# Patient Record
Sex: Female | Born: 1955 | Race: Black or African American | Hispanic: No | Marital: Married | Smoking: Current every day smoker
Health system: Southern US, Community
[De-identification: ages and names within clinical notes are randomized; demographics above are authoritative.]

## PROBLEM LIST (undated history)

## (undated) DIAGNOSIS — R569 Unspecified convulsions: Secondary | ICD-10-CM

---

## 2004-02-02 ENCOUNTER — Other Ambulatory Visit: Payer: Self-pay

## 2004-02-03 ENCOUNTER — Inpatient Hospital Stay: Payer: Self-pay | Admitting: Internal Medicine

## 2006-02-01 ENCOUNTER — Emergency Department: Payer: Self-pay

## 2009-08-08 ENCOUNTER — Emergency Department: Payer: Self-pay | Admitting: Emergency Medicine

## 2011-03-11 IMAGING — CT CT HEAD WITHOUT CONTRAST
2 series · 16 of 30 positions shown, 20 images · non-contrast
Comparison: none

REASON FOR EXAM: seizure, alcohol intoxication
COMMENTS:   May transport without cardiac monitor

PROCEDURE:     CT  - CT HEAD WITHOUT CONTRAST  - August 09, 2009 [DATE]
RESULT:     Comparison:  None
TECHNIQUE: Multiple axial images from the foramen magnum to the vertex were
obtained without IV contrast.

[Series 2: without · axial · non-contrast · 0.41mm/px · z∈[+168,+282]mm · 13 of 34 slices shown, 17 images]
[im 3/34  brain]
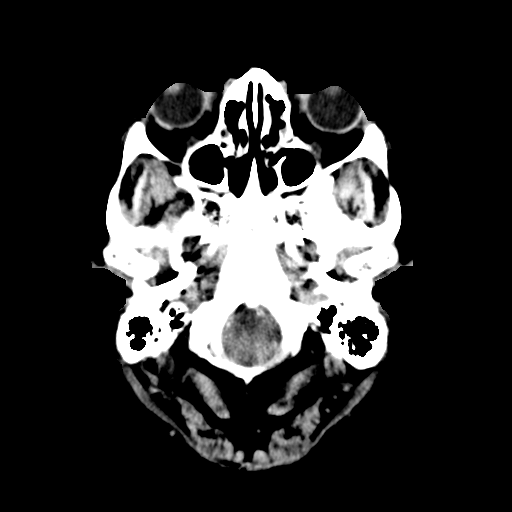
[im 3/34  bone]
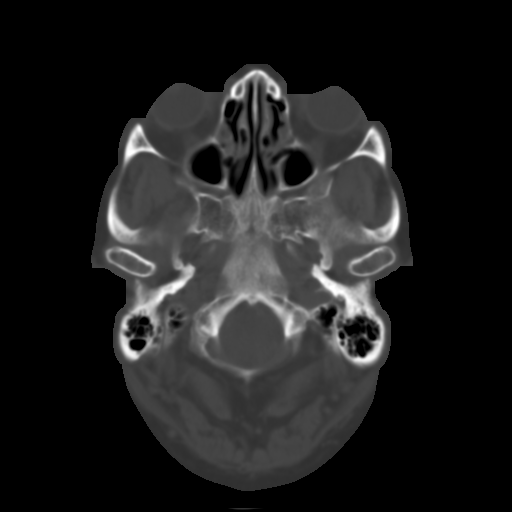
[im 5/34  brain]
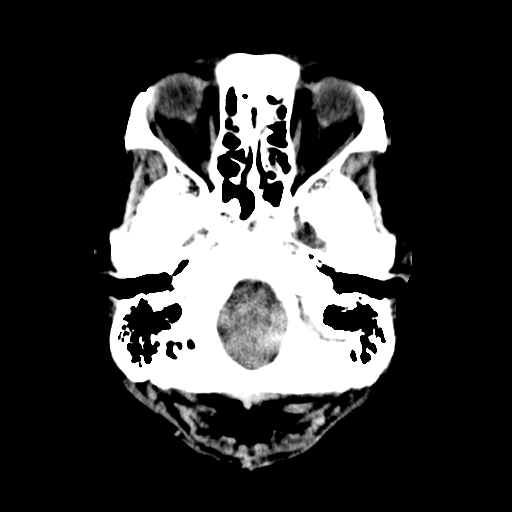
[im 8/34  brain]
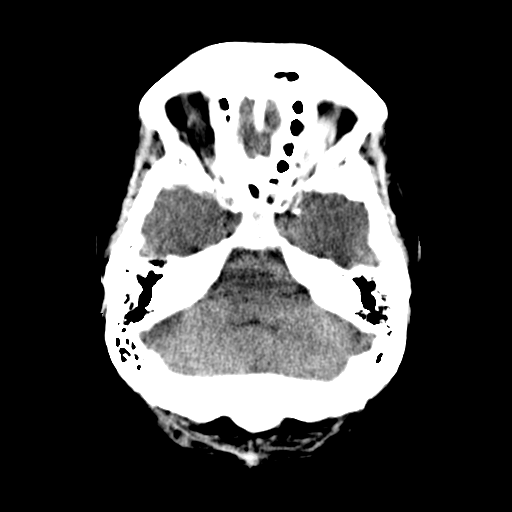
[im 10/34  brain]
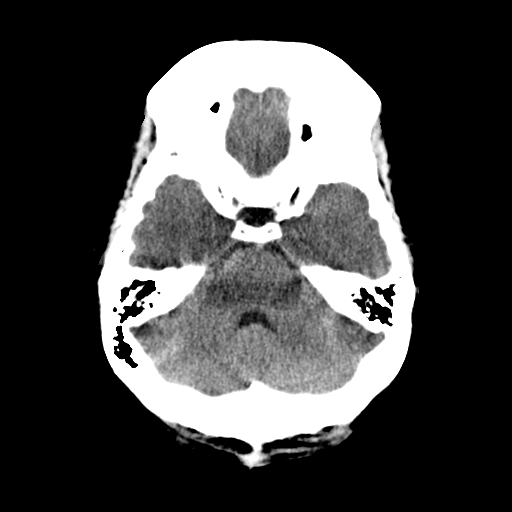
[im 12/34  brain]
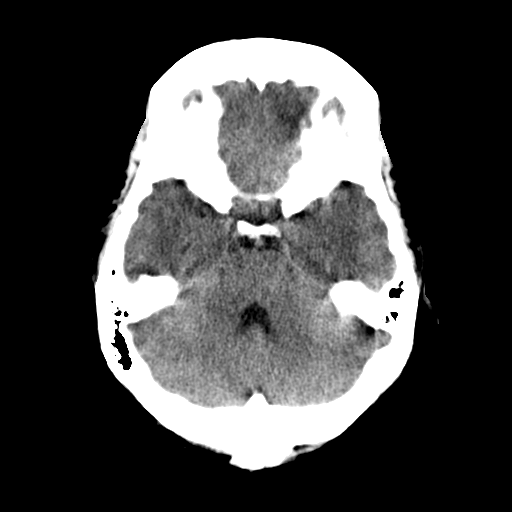
[im 12/34  bone]
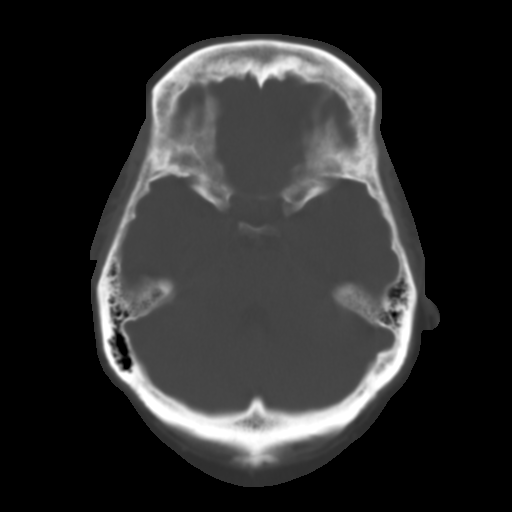
[im 15/34  brain]
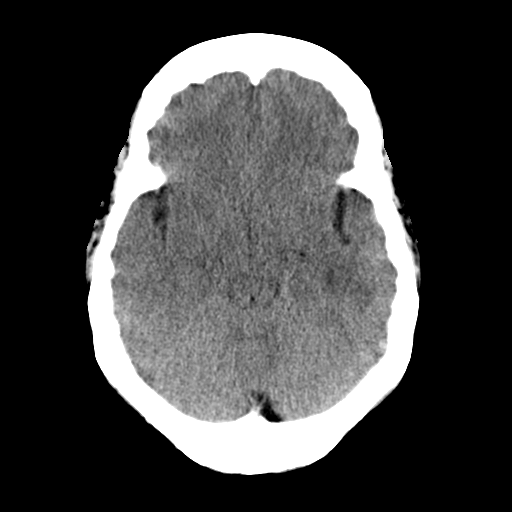
[im 17/34  brain]
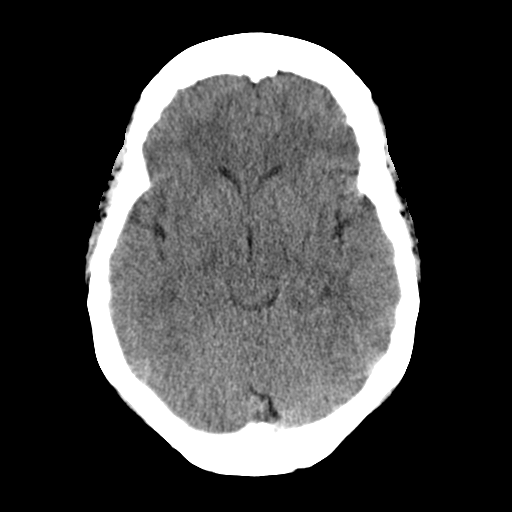
[im 19/34  brain]
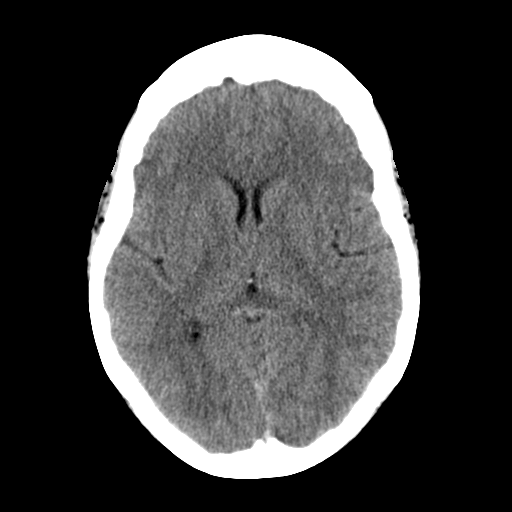
[im 22/34  brain]
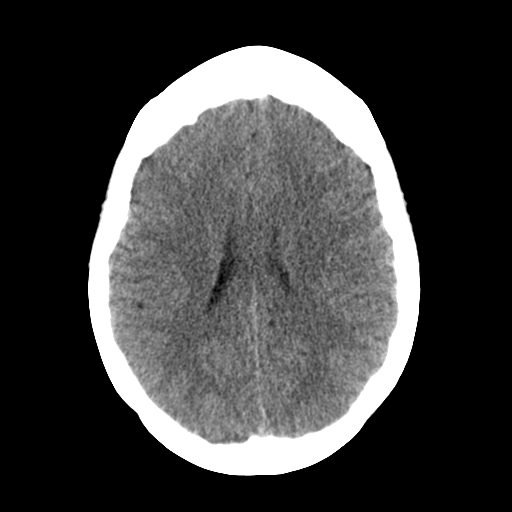
[im 22/34  bone]
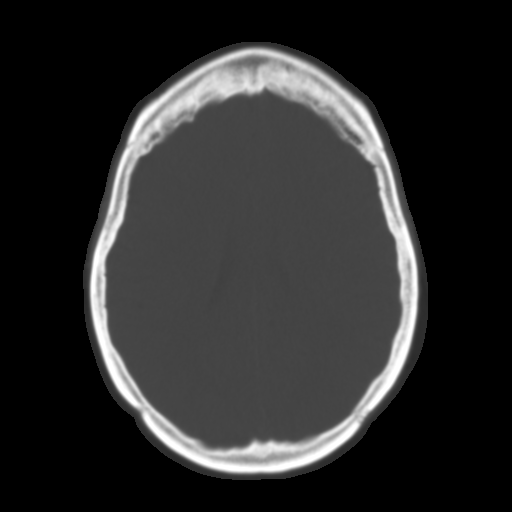
[im 24/34  brain]
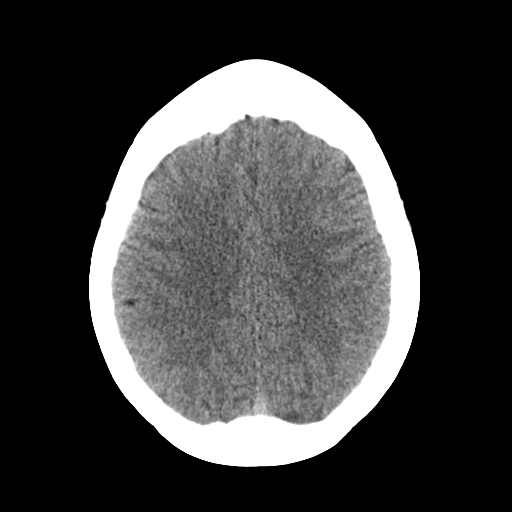
[im 26/34  brain]
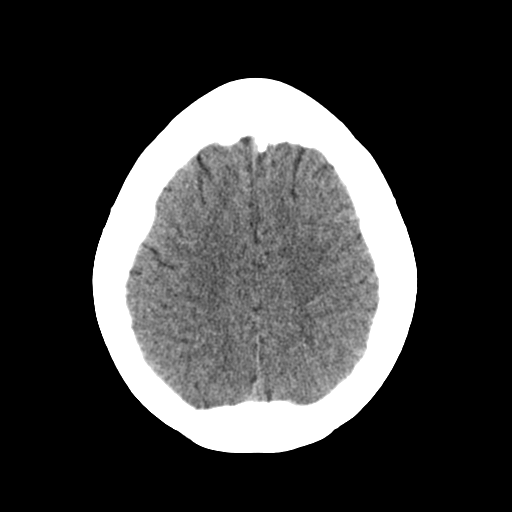
[im 29/34  brain]
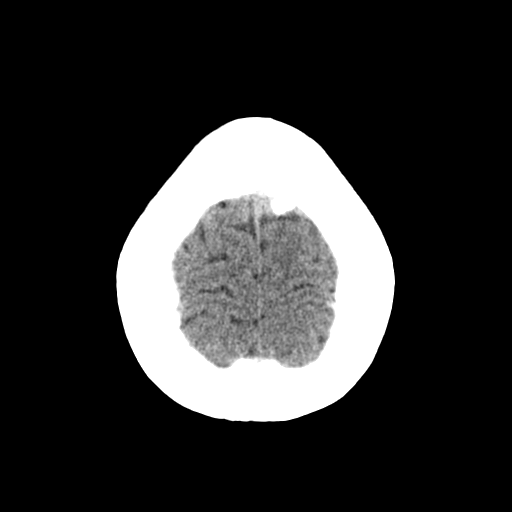
[im 31/34  brain]
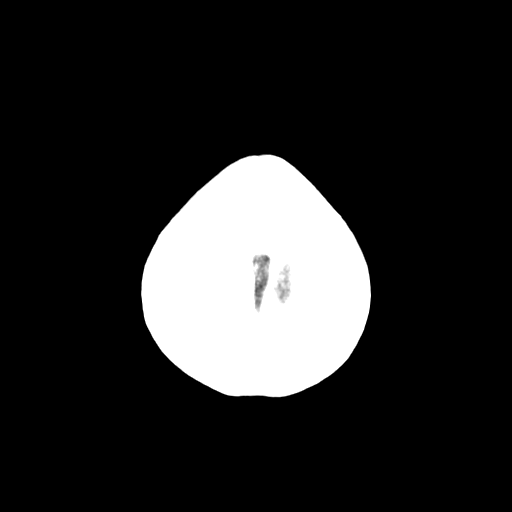
[im 31/34  bone]
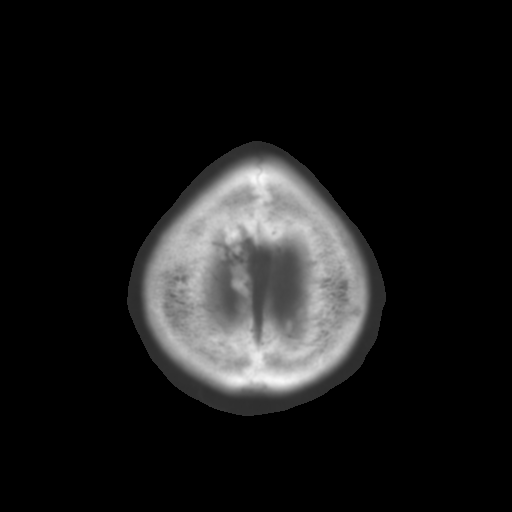

[Series 3: bone · axial · 0.41mm/px · z∈[+168,+198]mm · 3 of 34 slices shown]
[im 3/34  bone]
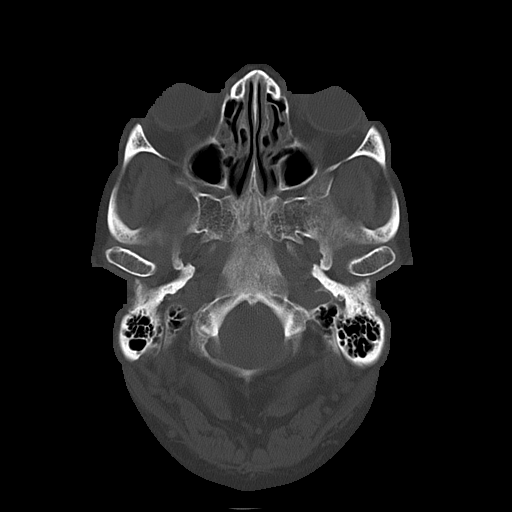
[im 8/34  bone]
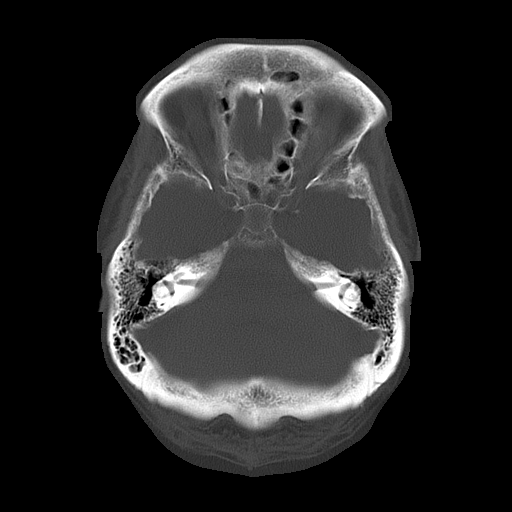
[im 12/34  bone]
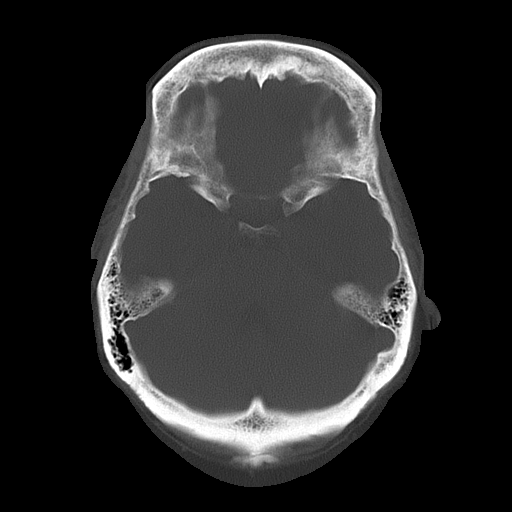

[16 of 30 positions shown; findings below may reference images not displayed]

FINDINGS: There is no evidence of mass effect, midline shift, or extra-axial fluid
collections.  There is no evidence of a space-occupying lesion or
intracranial hemorrhage. There is no evidence of a cortical-based area of
acute infarction.

The ventricles and sulci are appropriate for the patient's age. The basal
cisterns are patent.

Visualized portions of the orbits are unremarkable. There is mucosal
thickening involving the ethmoid sinuses bilaterally.

The osseous structures are unremarkable.
IMPRESSION: No acute intracranial process.

## 2012-03-18 ENCOUNTER — Emergency Department: Payer: Self-pay | Admitting: Emergency Medicine

## 2012-03-19 LAB — URINALYSIS, COMPLETE
Bilirubin,UR: NEGATIVE
Glucose,UR: NEGATIVE mg/dL (ref 0–75)
Ketone: NEGATIVE
Nitrite: NEGATIVE
Protein: 30

## 2012-03-19 LAB — CBC
HCT: 42.2 % (ref 35.0–47.0)
HGB: 13.9 g/dL (ref 12.0–16.0)
MCH: 28.5 pg (ref 26.0–34.0)
MCV: 86 fL (ref 80–100)
Platelet: 314 10*3/uL (ref 150–440)
RBC: 4.89 10*6/uL (ref 3.80–5.20)

## 2012-03-19 LAB — COMPREHENSIVE METABOLIC PANEL
BUN: 11 mg/dL (ref 7–18)
Co2: 24 mmol/L (ref 21–32)
Creatinine: 0.68 mg/dL (ref 0.60–1.30)
EGFR (Non-African Amer.): >60
Potassium: 3.7 mmol/L (ref 3.5–5.1)
SGOT(AST): 18 U/L (ref 15–37)
Sodium: 144 mmol/L (ref 136–145)

## 2012-03-19 LAB — DRUG SCREEN, URINE
Barbiturates, Ur Screen: NEGATIVE (ref ?–200)
Benzodiazepine, Ur Scrn: NEGATIVE (ref ?–200)
Cannabinoid 50 Ng, Ur ~~LOC~~: NEGATIVE (ref ?–50)
Cocaine Metabolite,Ur ~~LOC~~: NEGATIVE (ref ?–300)
Methadone, Ur Screen: NEGATIVE (ref ?–300)
Opiate, Ur Screen: NEGATIVE (ref ?–300)

## 2015-06-22 ENCOUNTER — Encounter: Payer: Self-pay | Admitting: Emergency Medicine

## 2015-06-22 ENCOUNTER — Emergency Department
Admission: EM | Admit: 2015-06-22 | Discharge: 2015-06-23 | Disposition: A | Discharge status: Home / Self Care | Payer: Self-pay | Attending: Emergency Medicine | Admitting: Emergency Medicine

## 2015-06-22 DIAGNOSIS — F10929 Alcohol use, unspecified with intoxication, unspecified: Secondary | ICD-10-CM

## 2015-06-22 DIAGNOSIS — F17210 Nicotine dependence, cigarettes, uncomplicated: Secondary | ICD-10-CM | POA: Insufficient documentation

## 2015-06-22 DIAGNOSIS — R569 Unspecified convulsions: Secondary | ICD-10-CM | POA: Insufficient documentation

## 2015-06-22 DIAGNOSIS — F10129 Alcohol abuse with intoxication, unspecified: Secondary | ICD-10-CM | POA: Insufficient documentation

## 2015-06-22 HISTORY — DX: Unspecified convulsions: R56.9

## 2015-06-22 LAB — COMPREHENSIVE METABOLIC PANEL
ALBUMIN: 4 g/dL (ref 3.5–5.0)
ALT: 14 U/L (ref 14–54)
ANION GAP: 10 (ref 5–15)
AST: 16 U/L (ref 15–41)
Alkaline Phosphatase: 97 U/L (ref 38–126)
BUN: 12 mg/dL (ref 6–20)
CO2: 20 mmol/L — AB (ref 22–32)
Calcium: 8.9 mg/dL (ref 8.9–10.3)
Chloride: 114 mmol/L — ABNORMAL HIGH (ref 101–111)
Creatinine, Ser: 0.75 mg/dL (ref 0.44–1.00)
GFR calc Af Amer: >60 mL/min (ref >60)
GFR calc non Af Amer: >60 mL/min (ref >60)
GLUCOSE: 112 mg/dL — AB (ref 65–99)
POTASSIUM: 3.6 mmol/L (ref 3.5–5.1)
SODIUM: 144 mmol/L (ref 135–145)
TOTAL PROTEIN: 8 g/dL (ref 6.5–8.1)

## 2015-06-22 LAB — CBC
HEMATOCRIT: 39.4 % (ref 35.0–47.0)
HEMOGLOBIN: 12.8 g/dL (ref 12.0–16.0)
MCH: 28.2 pg (ref 26.0–34.0)
MCHC: 32.4 g/dL (ref 32.0–36.0)
MCV: 87.1 fL (ref 80.0–100.0)
Platelets: 293 10*3/uL (ref 150–440)
RBC: 4.53 MIL/uL (ref 3.80–5.20)
RDW: 15.2 % — AB (ref 11.5–14.5)
WBC: 12.4 10*3/uL — AB (ref 3.6–11.0)

## 2015-06-22 MED ORDER — LEVETIRACETAM 500 MG/5ML IV SOLN
1000.0000 mg | Freq: Once | INTRAVENOUS | Status: AC
  Administered 2015-06-23: 1000 mg via INTRAVENOUS
  Filled 2015-06-22: qty 10

## 2015-06-22 NOTE — ED Notes (Signed)
Pt to rm 6 via EMS from home.  EMS reports they were called out for seizure, at scene pt was post ictal, they report they helped pt ambulate to truck during which pt had tonic clonic seizure lasting about 1 min, postictal period about 5 min.  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  Pt denies any seizure activity since stopping dilantin except tonight.  EMS report pt has been stressed, pt reports recent death in family and that she had etoh but will not say how much.  Pt denies pain.  Pt NAD at this time, resp equal and unlabored, skin warm and dry.

## 2015-06-23 ENCOUNTER — Emergency Department: Payer: Self-pay

## 2015-06-23 LAB — URINALYSIS COMPLETE WITH MICROSCOPIC (ARMC ONLY)
BACTERIA UA: NONE SEEN
Bilirubin Urine: NEGATIVE
Glucose, UA: NEGATIVE mg/dL
Ketones, ur: NEGATIVE mg/dL
LEUKOCYTES UA: NEGATIVE
Nitrite: NEGATIVE
PH: 5 (ref 5.0–8.0)
PROTEIN: NEGATIVE mg/dL
RBC / HPF: NONE SEEN RBC/hpf (ref 0–5)
Specific Gravity, Urine: 1.005 (ref 1.005–1.030)

## 2015-06-23 LAB — URINE DRUG SCREEN, QUALITATIVE (ARMC ONLY)
Amphetamines, Ur Screen: NOT DETECTED
BARBITURATES, UR SCREEN: NOT DETECTED
BENZODIAZEPINE, UR SCRN: NOT DETECTED
Cannabinoid 50 Ng, Ur ~~LOC~~: NOT DETECTED
Cocaine Metabolite,Ur ~~LOC~~: NOT DETECTED
MDMA (Ecstasy)Ur Screen: NOT DETECTED
METHADONE SCREEN, URINE: NOT DETECTED
OPIATE, UR SCREEN: NOT DETECTED
Phencyclidine (PCP) Ur S: NOT DETECTED
TRICYCLIC, UR SCREEN: NOT DETECTED

## 2015-06-23 LAB — ETHANOL: Alcohol, Ethyl (B): 209 mg/dL — ABNORMAL HIGH (ref <5)

## 2015-06-23 MED ORDER — LEVETIRACETAM 500 MG PO TABS: 500.0000 mg | ORAL_TABLET | Freq: Two times a day (BID) | ORAL | Status: AC

## 2015-06-23 NOTE — Discharge Instructions (Signed)
You have been seen in the emergency department today for a seizure.  Your workup today including labs are within normal limits.  Please follow up with your doctor/neurologist as soon as possible regarding today's emergency department visit and your likely seizure.  We provided a prescription for a seizure medication called Keppra, as well as a coupon which may help lower the cost of the medication at [facility].  Please take this medication as prescribed.  Please also know that your alcohol use may lower your seizure threshold, and you should try to cut back on your consumption (although stopping completely may also increase your risk of seizures).  As we have discussed it is very important that you do not drive until you have been seen and cleared by your neurologist.  Please drink plenty of fluids, get plenty of sleep and avoid any alcohol or drug use Please return to the emergency department if you have any further seizures which do not respond to medications, or for any other symptoms per se concerning for yourself.   Seizure, Adult A seizure means there is unusual activity in the brain. A seizure can cause changes in attention or behavior. Seizures often cause shaking (convulsions). Seizures often last from 30 seconds to 2 minutes. HOME CARE   If you are given medicines, take them exactly as told by your doctor.  Keep all doctor visits as told.  Do not swim or drive until your doctor says it is okay.  Teach others what to do if you have a seizure. They should:  Lay you on the ground.  Put a cushion under your head.  Loosen any tight clothing around your neck.  Turn you on your side.  Stay with you until you get better. GET HELP RIGHT AWAY IF:   The seizure lasts longer than 2 to 5 minutes.  The seizure is very bad.  The person does not wake up after the seizure.  The person's attention or behavior changes. Drive the person to the emergency room or call your local emergency  services (911 in [place] MAKE SURE YOU:   Understand these instructions.  Will watch your condition.  Will get help right away if you are not doing well or get worse.   This information is not intended to replace advice given to you by your health care provider. Make sure you discuss any questions you have with your health care provider.   Document Released: 08/05/2007 Document Revised: 05/11/2011 Document Reviewed: 09/28/2012 Elsevier Interactive Patient Education 2016 [employer].  Alcohol Intoxication Alcohol intoxication occurs when the amount of alcohol that a person has consumed impairs his or her ability to mentally and physically function. Alcohol directly impairs the normal chemical activity of the brain. Drinking large amounts of alcohol can lead to changes in mental function and behavior, and it can cause many physical effects that can be harmful.  Alcohol intoxication can range in severity from mild to very severe. Various factors can affect the level of intoxication that occurs, such as the person's age, gender, weight, frequency of alcohol consumption, and the presence of other medical conditions (such as diabetes, seizures, or heart conditions). Dangerous levels of alcohol intoxication may occur when people drink large amounts of alcohol in a short period (binge drinking). Alcohol can also be especially dangerous when combined with certain prescription medicines or "recreational" drugs. SIGNS AND SYMPTOMS Some common signs and symptoms of mild alcohol intoxication include:  Loss of coordination.  Changes in mood and behavior.  Impaired judgment.  Slurred speech. As alcohol intoxication progresses to more severe levels, other signs and symptoms will appear. These may include:  Vomiting.  Confusion and impaired memory.  Slowed breathing.  Seizures.  Loss of consciousness. DIAGNOSIS  Your health care provider will take a medical history and perform a physical exam.  You will be asked about the amount and type of alcohol you have consumed. Blood tests will be done to measure the concentration of alcohol in your blood. In many places, your blood alcohol level must be lower than 80 mg/dL (1.61%0.08%) to legally drive. However, many dangerous effects of alcohol can occur at much lower levels.  TREATMENT  People with alcohol intoxication often do not require treatment. Most of the effects of alcohol intoxication are temporary, and they go away as the alcohol naturally leaves the body. Your health care provider will monitor your condition until you are stable enough to go home. Fluids are sometimes given through an IV access tube to help prevent dehydration.  HOME CARE INSTRUCTIONS  Do not drive after drinking alcohol.  Stay hydrated. Drink enough water and fluids to keep your urine clear or pale yellow. Avoid caffeine.   Only take over-the-counter or prescription medicines as directed by your health care provider.  SEEK MEDICAL CARE IF:   You have persistent vomiting.   You do not feel better after a few days.  You have frequent alcohol intoxication. Your health care provider can help determine if you should see a substance use treatment counselor. SEEK IMMEDIATE MEDICAL CARE IF:   You become shaky or tremble when you try to stop drinking.   You shake uncontrollably (seizure).   You throw up (vomit) blood. This may be bright red or may look like black coffee grounds.   You have blood in your stool. This may be bright red or may appear as a black, tarry, bad smelling stool.   You become lightheaded or faint.  MAKE SURE YOU:   Understand these instructions.  Will watch your condition.  Will get help right away if you are not doing well or get worse.   This information is not intended to replace advice given to you by your health care provider. Make sure you discuss any questions you have with your health care provider.   Document Released:  11/26/2004 Document Revised: 10/19/2012 Document Reviewed: 07/22/2012 Elsevier Interactive Patient Education 2016 [employer]  Alcohol Use Disorder Alcohol use disorder is a mental disorder. It is not a one-time incident of heavy drinking. Alcohol use disorder is the excessive and uncontrollable use of alcohol over time that leads to problems with functioning in one or more areas of daily living. People with this disorder risk harming themselves and others when they drink to excess. Alcohol use disorder also can cause other mental disorders, such as mood and anxiety disorders, and serious physical problems. People with alcohol use disorder often misuse other drugs.  Alcohol use disorder is common and widespread. Some people with this disorder drink alcohol to cope with or escape from negative life events. Others drink to relieve chronic pain or symptoms of mental illness. People with a family history of alcohol use disorder are at higher risk of losing control and using alcohol to excess.  Drinking too much alcohol can cause injury, accidents, and health problems. One drink can be too much when you are:  Working.  Pregnant or breastfeeding.  Taking medicines. Ask your doctor.  Driving or planning to drive. SYMPTOMS  Signs  and symptoms of alcohol use disorder may include the following:   Consumption ofalcohol inlarger amounts or over a longer period of time than intended.  Multiple unsuccessful attempts to cutdown or control alcohol use.   A great deal of time spent obtaining alcohol, using alcohol, or recovering from the effects of alcohol (hangover).  A strong desire or urge to use alcohol (cravings).   Continued use of alcohol despite problems at work, school, or home because of alcohol use.   Continued use of alcohol despite problems in relationships because of alcohol use.  Continued use of alcohol in situations when it is physically hazardous, such as driving a  car.  Continued use of alcohol despite awareness of a physical or psychological problem that is likely related to alcohol use. Physical problems related to alcohol use can involve the brain, heart, liver, stomach, and intestines. Psychological problems related to alcohol use include intoxication, depression, anxiety, psychosis, delirium, and dementia.   The need for increased amounts of alcohol to achieve the same desired effect, or a decreased effect from the consumption of the same amount of alcohol (tolerance).  Withdrawal symptoms upon reducing or stopping alcohol use, or alcohol use to reduce or avoid withdrawal symptoms. Withdrawal symptoms include:  Racing heart.  Hand tremor.  Difficulty sleeping.  Nausea.  Vomiting.  Hallucinations.  Restlessness.  Seizures. DIAGNOSIS Alcohol use disorder is diagnosed through an assessment by your health care provider. Your health care provider may start by asking three or four questions to screen for excessive or problematic alcohol use. To confirm a diagnosis of alcohol use disorder, at least two symptoms must be present within a 28-month period. The severity of alcohol use disorder depends on the number of symptoms:  Mild--two or three.  Moderate--four or five.  Severe--six or more. Your health care provider may perform a physical exam or use results from lab tests to see if you have physical problems resulting from alcohol use. Your health care provider may refer you to a mental health professional for evaluation. TREATMENT  Some people with alcohol use disorder are able to reduce their alcohol use to low-risk levels. Some people with alcohol use disorder need to quit drinking alcohol. When necessary, mental health professionals with specialized training in substance use treatment can help. Your health care provider can help you decide how severe your alcohol use disorder is and what type of treatment you need. The following forms of  treatment are available:   Detoxification. Detoxification involves the use of prescription medicines to prevent alcohol withdrawal symptoms in the first week after quitting. This is important for people with a history of symptoms of withdrawal and for heavy drinkers who are likely to have withdrawal symptoms. Alcohol withdrawal can be dangerous and, in severe cases, cause death. Detoxification is usually provided in a hospital or in-patient substance use treatment facility.  Counseling or talk therapy. Talk therapy is provided by substance use treatment counselors. It addresses the reasons people use alcohol and ways to keep them from drinking again. The goals of talk therapy are to help people with alcohol use disorder find healthy activities and ways to cope with life stress, to identify and avoid triggers for alcohol use, and to handle cravings, which can cause relapse.  Medicines.Different medicines can help treat alcohol use disorder through the following actions:  Decrease alcohol cravings.  Decrease the positive reward response felt from alcohol use.  Produce an uncomfortable physical reaction when alcohol is used (aversion therapy).  Support groups.  Support groups are run by people who have quit drinking. They provide emotional support, advice, and guidance. These forms of treatment are often combined. Some people with alcohol use disorder benefit from intensive combination treatment provided by specialized substance use treatment centers. Both inpatient and outpatient treatment programs are available.   This information is not intended to replace advice given to you by your health care provider. Make sure you discuss any questions you have with your health care provider.   Document Released: 03/26/2004 Document Revised: 03/09/2014 Document Reviewed: 05/26/2012 Elsevier Interactive Patient Education [employer]

## 2015-06-23 NOTE — ED Provider Notes (Signed)
[facility] Emergency Department Provider Note  ____________________________________________  Time seen: Approximately 1:17 AM  I have reviewed the triage vital signs and the nursing notes.   HISTORY  Chief Complaint Seizures  History is limited by alcohol intoxication and possibly postictal state  HPI Brittany Perkins is a 60 y.o. 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  She arrives by EMS for evaluation of 2 seizure-like episodes at home.  Reportedly she had a brief seizure at home and her daughter reports that she was alert and oriented and talking to the daughter without any difficulty after the first episode.  After EMS arrived against the patient's wishes (she did not want to go to the hospital), she reportedly had another episode that lasted several minutes.  She did not get any medication at home or by EMS.  The onset of her symptoms was reportedly acute and severe although she has been stable in the emergency department.  She admits to drinking a large but quantity unknown amount of alcohol today.  Denies fever/chills, chest pain, shortness of breath, abdominal pain.  She states that she is sleepy and wants to go home.   Past Medical History  Diagnosis Date  . Seizures (HCC)     There are no active problems to display for this patient.   History reviewed. No pertinent past surgical history.  Current Outpatient Rx  Name  Route  Sig  Dispense  Refill  . levETIRAcetam (KEPPRA) 500 MG tablet   Oral   Take 1 tablet (500 mg total) by mouth 2 (two) times daily. Take 1 tablet (500 mg) by mouth twice daily for two weeks.  Then increase to 2 tablets (1000 mg) by mouth twice daily. Follow up by then with your regular doctor.   84 tablet   0     Allergies Review of patient's allergies indicates no known allergies.  History  reviewed. No pertinent family history.  Social History Social History  Substance Use Topics  . Smoking status: Current Every Day Smoker -- 0.50 packs/day    Types: Cigarettes  . Smokeless tobacco: None  . Alcohol Use: Yes    Review of Systems Constitutional: No fever/chills Eyes: No visual changes. ENT: No sore throat. Cardiovascular: Denies chest pain. Respiratory: Denies shortness of breath. Gastrointestinal: No abdominal pain.  No nausea, no vomiting.  No diarrhea.  No constipation. Genitourinary: Negative for dysuria. Musculoskeletal: Negative for back pain. Skin: Negative for rash. Neurological: Negative for headaches, focal weakness or numbness.  Reportedly had two seizure-like episodes at home.  10-point ROS otherwise negative.  ____________________________________________   PHYSICAL EXAM:  VITAL SIGNS: ED Triage Vitals  Enc Vitals Group     BP 06/22/15 2316 179/90 mmHg     Pulse Rate 06/22/15 2316 82     Resp 06/22/15 2316 16     Temp 06/22/15 2316 98.2 F (36.8 C)     Temp Source 06/22/15 2316 Oral     SpO2 06/22/15 2316 95 %     Weight 06/22/15 2316 160 lb (72.576 kg)     Height 06/22/15 2316  (1.651 m)     Head Cir --      Peak Flow --      Pain Score 06/22/15 2317 0     Pain Loc --      Pain Edu? --  Excl. in GC? --     Constitutional: Somnolent, opens eyes and communicates appropriately to voice and light touch. Eyes: Conjunctivae are normal. PERRL. EOMI. Head: Atraumatic. Nose: No congestion/rhinnorhea. Mouth/Throat: Mucous membranes are moist.  Oropharynx non-erythematous. Neck: No stridor.  No meningeal signs.  No cervical spine tenderness to palpation. Cardiovascular: Normal rate, regular rhythm. Good peripheral circulation. Grossly normal heart sounds.   Respiratory: Normal respiratory effort.  No retractions. Lungs CTAB. Gastrointestinal: Soft and nontender. No distention.  Musculoskeletal: No lower extremity tenderness nor edema.  No gross deformities of extremities. Neurologic:  Normal speech and language. No gross focal neurologic deficits are appreciated.  Skin:  Skin is warm, dry and intact. No rash noted.  ____________________________________________   LABS (all labs ordered are listed, but only abnormal results are displayed)  Labs Reviewed  CBC - Abnormal; Notable for the following:    WBC 12.4 (*)    RDW 15.2 (*)    All other components within normal limits  COMPREHENSIVE METABOLIC PANEL - Abnormal; Notable for the following:    Chloride 114 (*)    CO2 20 (*)    Glucose, Bld 112 (*)    Total Bilirubin <0.1 (*)    All other components within normal limits  ETHANOL - Abnormal; Notable for the following:    Alcohol, Ethyl (B) 209 (*)    All other components within normal limits  URINALYSIS COMPLETEWITH MICROSCOPIC ([facility] ONLY) - Abnormal; Notable for the following:    Color, Urine STRAW (*)    APPearance CLEAR (*)    Hgb urine dipstick 2+ (*)    Squamous Epithelial / LPF 0-5 (*)    All other components within normal limits  URINE DRUG SCREEN, QUALITATIVE ([facility] ONLY)   ____________________________________________  EKG  None ____________________________________________  RADIOLOGY   Ct Head Wo Contrast  06/23/2015  CLINICAL DATA:  Seizures. EXAM: CT HEAD WITHOUT CONTRAST TECHNIQUE: Contiguous axial images were obtained from the base of the skull through the vertex without intravenous contrast. COMPARISON:  08/09/2009 FINDINGS: Ventricles and sulci appear symmetrical. No ventricular dilatation. No mass effect or midline shift. No abnormal extra-axial fluid collections. Gray-white matter junctions are distinct. Basal cisterns are not effaced. No evidence of acute intracranial hemorrhage. No depressed skull fractures. Mucosal thickening throughout the paranasal sinuses. Mastoid air cells are not opacified. IMPRESSION: No acute intracranial abnormalities. Electronically Signed   By: Burman Nieves  M.D.   On: 06/23/2015 01:42    ____________________________________________   PROCEDURES  Procedure(s) performed: None  Critical Care performed: No ____________________________________________   INITIAL IMPRESSION / ASSESSMENT AND PLAN / ED COURSE  Pertinent labs & imaging results that were available during my care of the patient were reviewed by me and considered in my medical decision making (see chart for details).  I have no information in the computer about her past medical history aside do not know if she had an epileptic seizure disorder or non-epileptiform seizures.  Alcohol level is high today at more than 200 which may have lowered her seizure threshold.  She also reports being under a lot of stress and stated that can incite her seizures, which increases my suspicion of pseudoseizures or psychogenic seizures.  Regardless, her workup is reassuring and her family is ready to take her home.  I gave her Keppra 1 g IV in the ED and will provide a prescription, and I encouraged close outpatient follow up.  Family understands and agrees.  Gave usual precautions for patient not to drive until cleared  by neurology.  ____________________________________________  FINAL CLINICAL IMPRESSION(S) / ED DIAGNOSES  Final diagnoses:  Seizure (HCC)  Alcohol intoxication, with unspecified complication (HCC)      NEW MEDICATIONS STARTED DURING THIS VISIT:  New Prescriptions   LEVETIRACETAM (KEPPRA) 500 MG TABLET    Take 1 tablet (500 mg total) by mouth 2 (two) times daily. Take 1 tablet (500 mg) by mouth twice daily for two weeks.  Then increase to 2 tablets (1000 mg) by mouth twice daily. Follow up by then with your regular doctor.      Note:  This document was prepared using Dragon voice recognition software and may include unintentional dictation errors.   Loleta Roseory Abbigaile Rockman, MD 06/23/15 [phone]

## 2017-01-22 IMAGING — CT CT HEAD W/O CM
1 series · 16 of 30 positions shown, 20 images · non-contrast
Comparison: 08/09/2009

CLINICAL DATA: Seizures.

EXAM:
CT HEAD WITHOUT CONTRAST
TECHNIQUE: Contiguous axial images were obtained from the base of the skull
through the vertex without intravenous contrast.

[Series 2: head wo · axial · 0.47mm/px · z∈[-149,-23]mm · 16 of 32 slices shown, 20 images]
[im 2/32  brain]
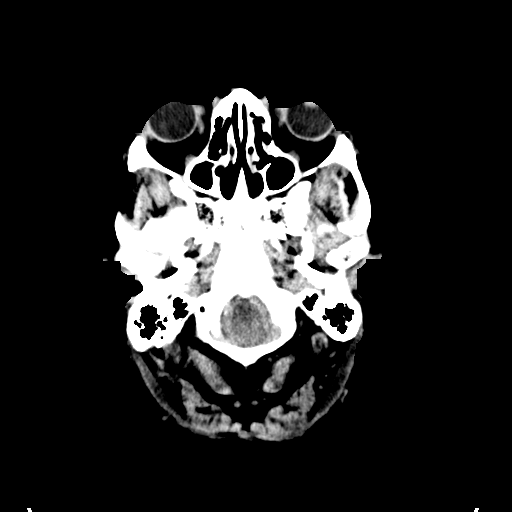
[im 2/32  bone]
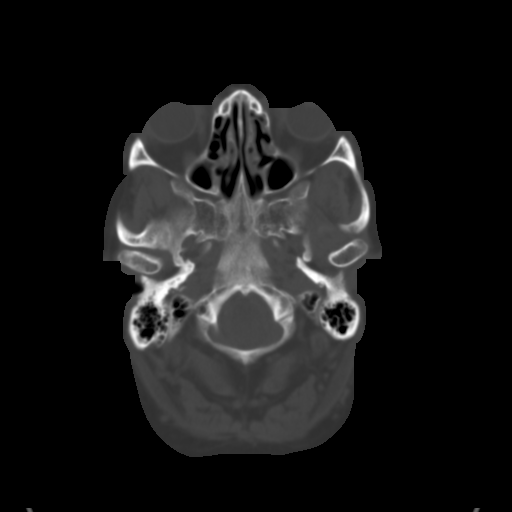
[im 4/32  brain]
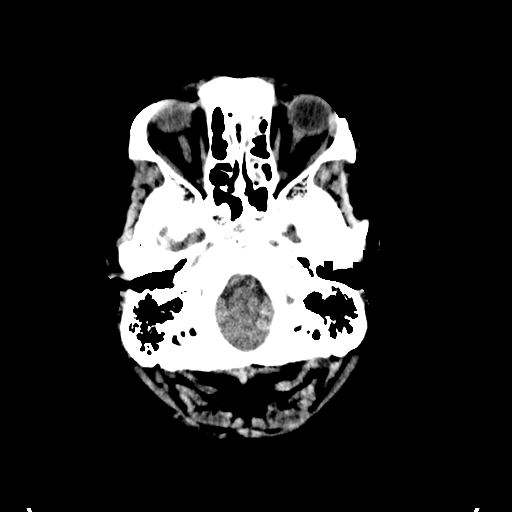
[im 6/32  brain]
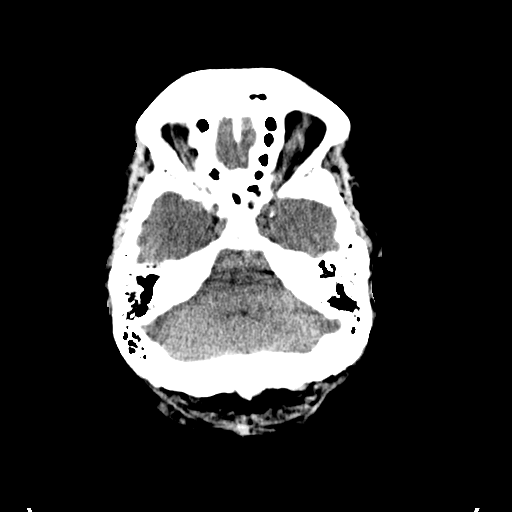
[im 8/32  brain]
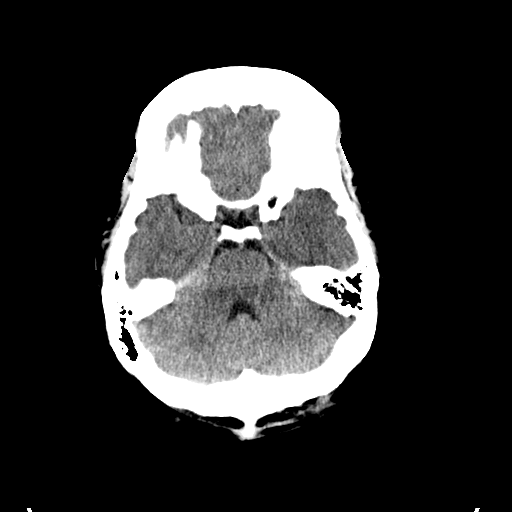
[im 9/32  brain]
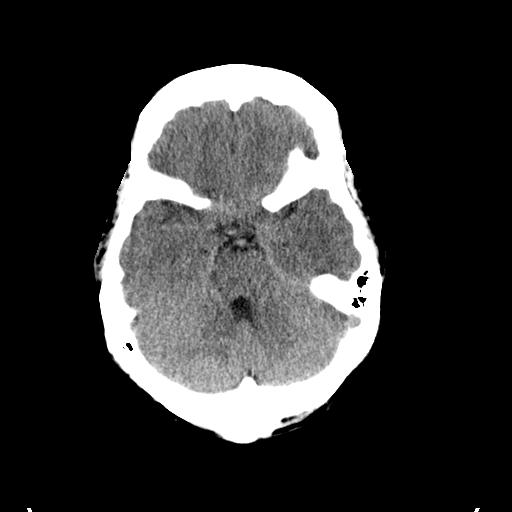
[im 9/32  bone]
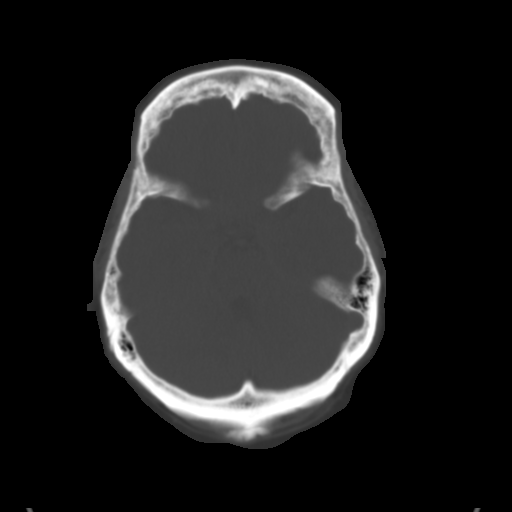
[im 11/32  brain]
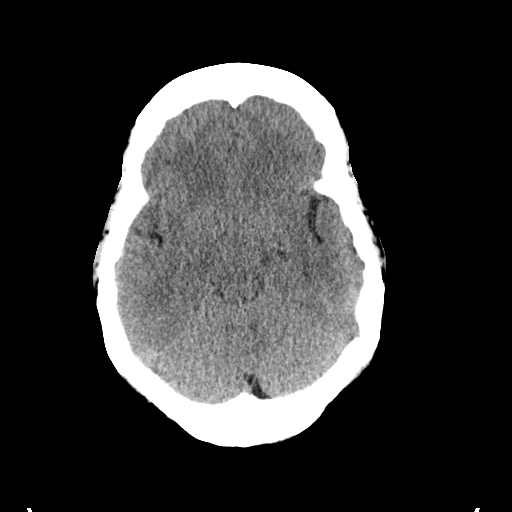
[im 13/32  brain]
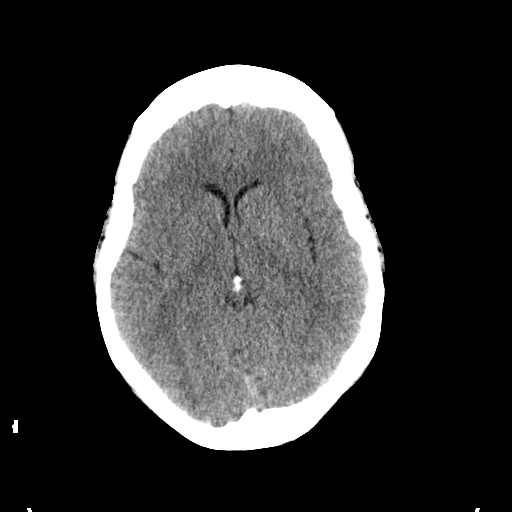
[im 15/32  brain]
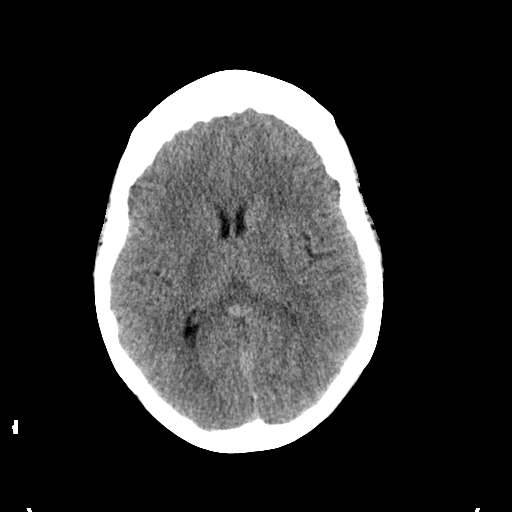
[im 17/32  brain]
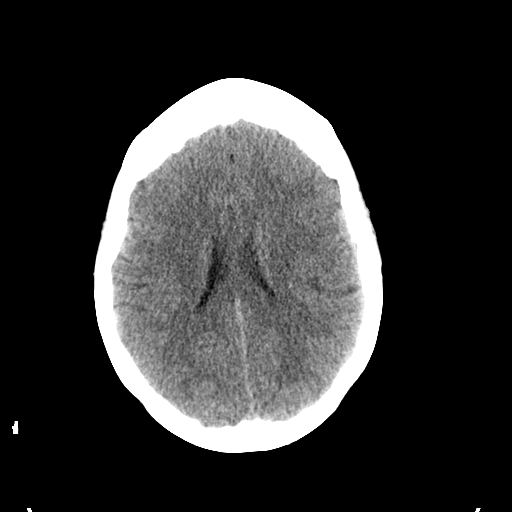
[im 17/32  bone]
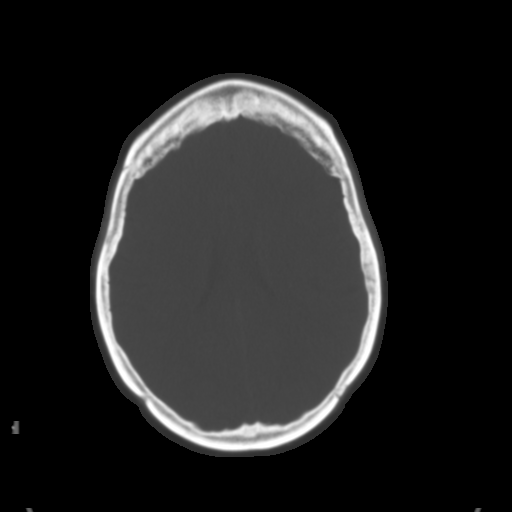
[im 19/32  brain]
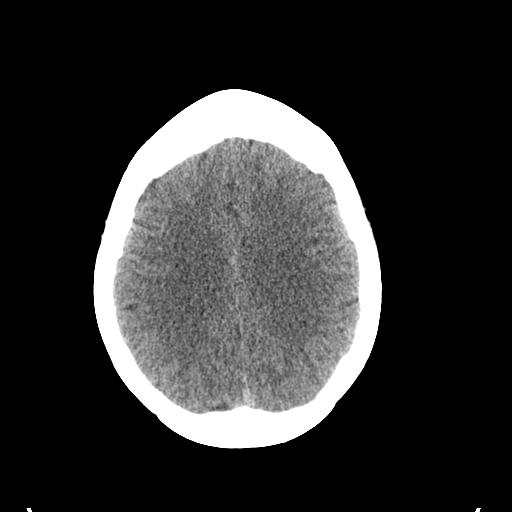
[im 21/32  brain]
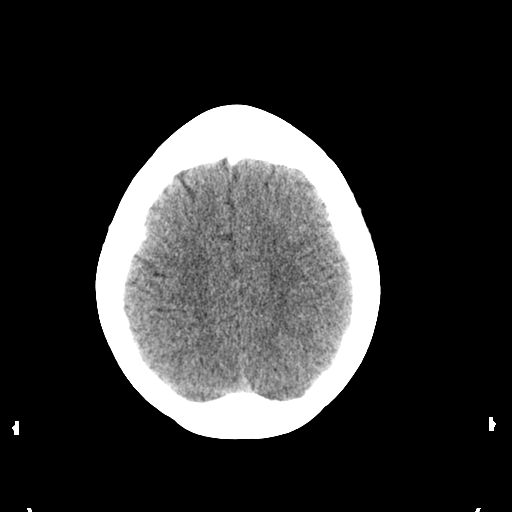
[im 23/32  brain]
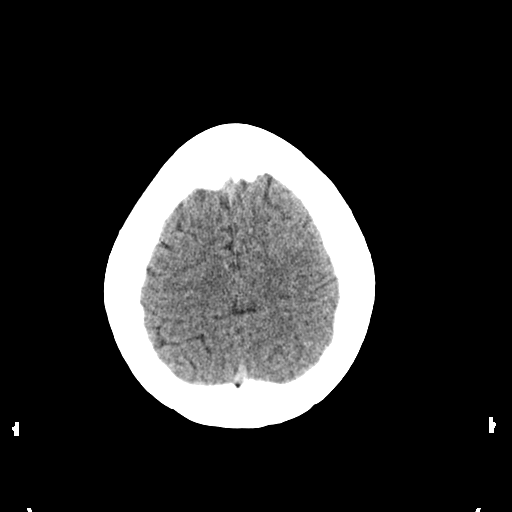
[im 24/32  brain]
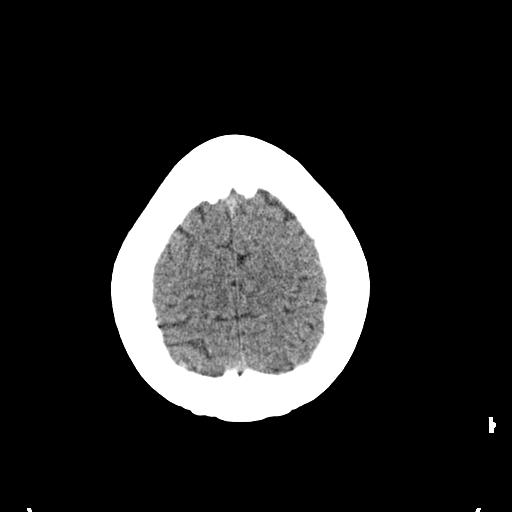
[im 24/32  bone]
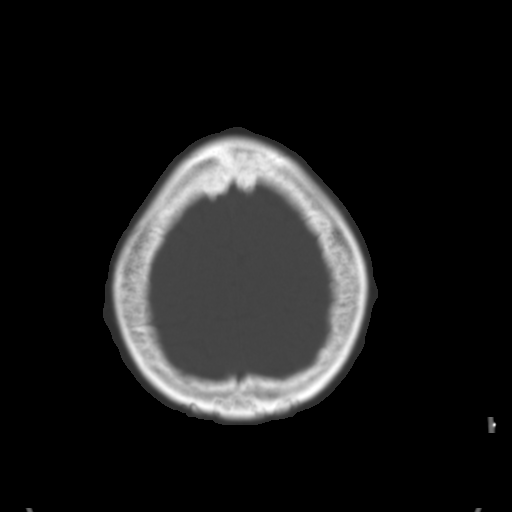
[im 26/32  brain]
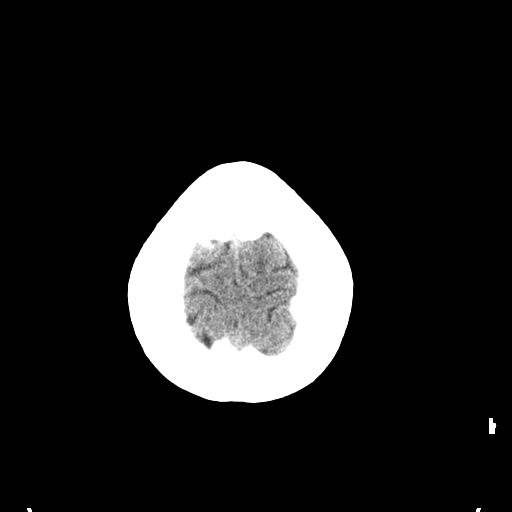
[im 28/32  brain]
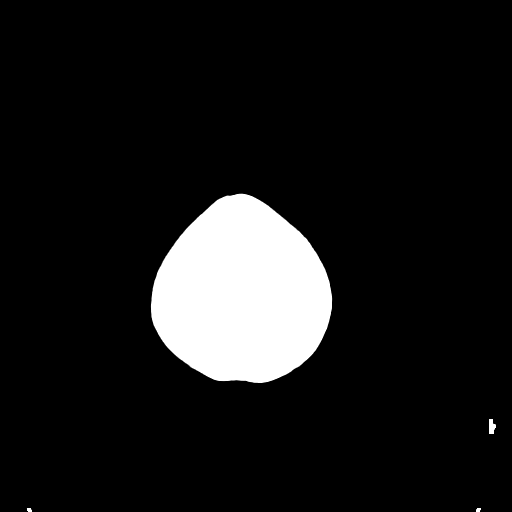
[im 30/32  brain]
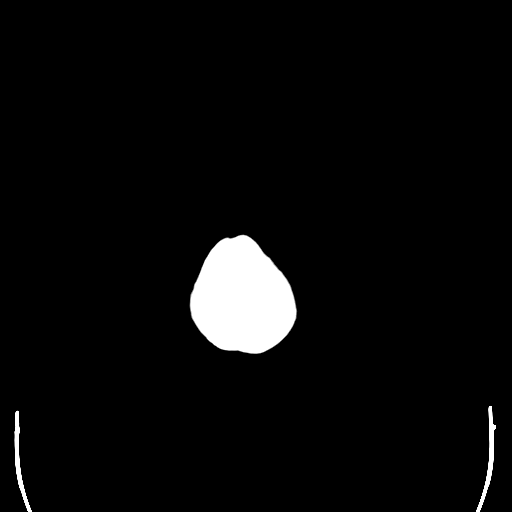

[16 of 30 positions shown; findings below may reference images not displayed]

FINDINGS: Ventricles and sulci appear symmetrical. No ventricular dilatation.
No mass effect or midline shift. No abnormal extra-axial fluid
collections. Gray-white matter junctions are distinct. Basal
cisterns are not effaced. No evidence of acute intracranial
hemorrhage. No depressed skull fractures. Mucosal thickening
throughout the paranasal sinuses. Mastoid air cells are not
opacified.
IMPRESSION: No acute intracranial abnormalities.

## 2018-05-20 ENCOUNTER — Other Ambulatory Visit: Payer: Self-pay

## 2018-05-20 ENCOUNTER — Encounter: Payer: Self-pay | Admitting: *Deleted

## 2018-05-20 DIAGNOSIS — Z79899 Other long term (current) drug therapy: Secondary | ICD-10-CM | POA: Insufficient documentation

## 2018-05-20 DIAGNOSIS — F17210 Nicotine dependence, cigarettes, uncomplicated: Secondary | ICD-10-CM | POA: Insufficient documentation

## 2018-05-20 DIAGNOSIS — R569 Unspecified convulsions: Secondary | ICD-10-CM | POA: Insufficient documentation

## 2018-05-20 LAB — CBC
HCT: 37.3 % (ref 36.0–46.0)
Hemoglobin: 11.9 g/dL — ABNORMAL LOW (ref 12.0–15.0)
MCH: 27 pg (ref 26.0–34.0)
MCHC: 31.9 g/dL (ref 30.0–36.0)
MCV: 84.6 fL (ref 80.0–100.0)
NRBC: 0 % (ref 0.0–0.2)
Platelets: 350 10*3/uL (ref 150–400)
RBC: 4.41 MIL/uL (ref 3.87–5.11)
RDW: 15.7 % — ABNORMAL HIGH (ref 11.5–15.5)
WBC: 11.8 10*3/uL — ABNORMAL HIGH (ref 4.0–10.5)
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
## 2018-05-21 ENCOUNTER — Emergency Department
Admission: EM | Admit: 2018-05-21 | Discharge: 2018-05-21 | Disposition: A | Discharge status: Home / Self Care | Payer: Self-pay | Attending: Emergency Medicine | Admitting: Emergency Medicine

## 2018-05-21 DIAGNOSIS — R569 Unspecified convulsions: Secondary | ICD-10-CM

## 2018-05-21 LAB — BASIC METABOLIC PANEL
Anion gap: 11 (ref 5–15)
BUN: 11 mg/dL (ref 8–23)
CHLORIDE: 112 mmol/L — AB (ref 98–111)
CO2: 21 mmol/L — AB (ref 22–32)
CREATININE: 0.61 mg/dL (ref 0.44–1.00)
Calcium: 8.3 mg/dL — ABNORMAL LOW (ref 8.9–10.3)
GFR calc non Af Amer: >60 mL/min (ref >60)
Glucose, Bld: 109 mg/dL — ABNORMAL HIGH (ref 70–99)
Potassium: 3.4 mmol/L — ABNORMAL LOW (ref 3.5–5.1)
Sodium: 144 mmol/L (ref 135–145)

## 2018-05-21 LAB — ETHANOL: ALCOHOL ETHYL (B): 60 mg/dL — AB (ref <10)

## 2018-05-21 MED ORDER — PHENYTOIN SODIUM EXTENDED 100 MG PO CAPS: 100.0000 mg | ORAL_CAPSULE | Freq: Three times a day (TID) | ORAL | 2 refills | Status: AC
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
## 2018-05-21 NOTE — Discharge Instructions (Signed)

## 2018-05-21 NOTE — ED Provider Notes (Signed)
[facility] Emergency Department Provider Note  ____________________________________________   First MD Initiated Contact with Patient 05/21/18 [phone]     (approximate)  I have reviewed the triage vital signs and the nursing notes.   HISTORY  Chief Complaint Seizures    HPI Brittany Perkins is a 63 y.o. 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  Reportedly she and 1 of her daughters got into an argument tonight and she had seizure-like activity.  She returned to baseline immediately afterwards.  She has been noncompliant with her Dilantin for years.  She says that this happens sometimes when she gets upset particular after she drinks alcohol.  She denies any recent illnesses.  She denies fever/chills, chest pain, shortness of breath, nausea, vomiting, and abdominal pain.  She is back to her baseline and she has a niece here with her who agrees she has back at her baseline.  The patient denies any recent dysuria and has had no other issues.  She admits that she does not take her seizure medicine but is interested in starting back on it.  The onset of her symptoms was acute, they were brief, severe, but completely resolved.  She has been observed in the emergency department for almost 6 hours.         Past Medical History:  Diagnosis Date  . Seizures (HCC)     There are no active problems to display for this patient.   History reviewed. No pertinent surgical history.  Prior to Admission medications   Medication Sig Start Date End Date Taking? Authorizing Provider  levETIRAcetam (KEPPRA) 500 MG tablet Take 1 tablet (500 mg total) by mouth 2 (two) times daily. Take 1 tablet (500 mg) by mouth twice daily for two weeks.  Then increase to 2 tablets (1000 mg) by mouth twice daily. Follow up by then with your regular doctor. Patient not taking: Reported  on 05/21/2018 06/23/15   Loleta Rose, MD  phenytoin (DILANTIN) 100 MG ER capsule Take 1 capsule (100 mg total) by mouth 3 (three) times daily. 05/21/18 05/21/19  Loleta Rose, MD    Allergies Patient has no known allergies.  No family history on file.  Social History Social History   Tobacco Use  . Smoking status: Current Every Day Smoker    Packs/day: 0.50    Types: Cigarettes  Substance Use Topics  . Alcohol use: Yes  . Drug use: Never    Review of Systems Constitutional: No fever/chills Eyes: No visual changes. ENT: No sore throat. Cardiovascular: Denies chest pain. Respiratory: Denies shortness of breath. Gastrointestinal: No abdominal pain.  No nausea, no vomiting.  No diarrhea.  No constipation. Genitourinary: Negative for dysuria. Musculoskeletal: Negative for neck pain.  Negative for back pain. Integumentary: Negative for rash. Neurological: Seizure-like activity.  Negative for headaches, focal weakness or numbness.   ____________________________________________   PHYSICAL EXAM:  VITAL SIGNS: ED Triage Vitals  Enc Vitals Group     BP 05/20/18 2320 (!) 179/75     Pulse Rate 05/20/18 2320 91     Resp 05/20/18 2320 16     Temp 05/20/18 2320 98.2 F (36.8 C)     Temp Source 05/20/18 2320 Oral     SpO2 05/20/18 2320 92 %     Weight --      Height --      Head Circumference --  Peak Flow --      Pain Score 05/20/18 2322 0     Pain Loc --      Pain Edu? --      Excl. in GC? --     Constitutional: Alert and oriented. Well appearing and in no acute distress. Eyes: Conjunctivae are normal. PERRL. EOMI. Head: Atraumatic. Nose: No congestion/rhinnorhea. Mouth/Throat: Mucous membranes are moist. Neck: No stridor.  No meningeal signs.   Cardiovascular: Normal rate, regular rhythm. Good peripheral circulation. Grossly normal heart sounds. Respiratory: Normal respiratory effort.  No retractions. Lungs CTAB. Gastrointestinal: Soft and nontender. No  distention.  Musculoskeletal: No lower extremity tenderness nor edema. No gross deformities of extremities. Neurologic:  Normal speech and language. No gross focal neurologic deficits are appreciated.  Skin:  Skin is warm, dry and intact. No rash noted. Psychiatric: Mood and affect are normal. Speech and behavior are normal.  ____________________________________________   LABS (all labs ordered are listed, but only abnormal results are displayed)  Labs Reviewed  BASIC METABOLIC PANEL - Abnormal; Notable for the following components:      Result Value   Potassium 3.4 (*)    Chloride 112 (*)    CO2 21 (*)    Glucose, Bld 109 (*)    Calcium 8.3 (*)    All other components within normal limits  CBC - Abnormal; Notable for the following components:   WBC 11.8 (*)    Hemoglobin 11.9 (*)    RDW 15.7 (*)    All other components within normal limits  ETHANOL - Abnormal; Notable for the following components:   Alcohol, Ethyl (B) 60 (*)    All other components within normal limits   ____________________________________________  EKG  No indication for EKG ____________________________________________  RADIOLOGY   ED MD interpretation: No indication for imaging  Official radiology report(s): No results found.  ____________________________________________   PROCEDURES   Procedure(s) performed (including Critical Care):  Procedures   ____________________________________________   INITIAL IMPRESSION / MDM / ASSESSMENT AND PLAN / ED COURSE  As part of my medical decision making, I reviewed the following data within the electronic MEDICAL RECORD NUMBER History obtained from family, Nursing notes reviewed and incorporated, Labs reviewed , Old chart reviewed and Notes from prior ED visits         Differential diagnosis includes, but is not limited to, pseudoseizures or nonepileptic seizures, true epileptiform seizures, alcohol induced or alcohol withdrawal seizures, medication  side effect, intracranial bleeding, CVA.  The patient is back at her baseline and has been so for at least 6 hours.  I looked in her medical record and I was the one to see her last in the emergency department in 2017 (2 to 3 years ago).  She had similar symptoms and presentation at that time.  Her seizure-like activity seems to occur after alcohol use and after stressful interactions with family.  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  I talked her about the medications and she is willing to start back on Dilantin which I think is unlikely to be harmful so I will write her a new prescription but I do not think that she would benefit from a loading dose tonight given that she has been off the medication for at least 4 years, and when I saw her in 2017 she had been off it for 10 years.  She is comfortable with this plan.  Her blood pressure is elevated but is  likely at baseline and she is completely asymptomatic at this time and wants to go home.  I gave my usual customary return precautions and she and her niece understand and agree with the plan.     ____________________________________________  FINAL CLINICAL IMPRESSION(S) / ED DIAGNOSES  Final diagnoses:  Seizure-like activity (HCC)     MEDICATIONS GIVEN DURING THIS VISIT:  Medications - No data to display   ED Discharge Orders         Ordered    phenytoin (DILANTIN) 100 MG ER capsule  3 times daily     05/21/18 0420           Note:  This document was prepared using Dragon voice recognition software and may include unintentional dictation errors.   Loleta Rose, MD 05/21/18 [phone]

## 2018-05-21 NOTE — ED Notes (Signed)
ED Provider at bedside. 

## 2023-12-15 NOTE — Progress Notes (Signed)
 Brittany Perkins                                          MRN: [id number]   12/15/2023   The [facility] Quality Team Specialist reviewed this patient medical record for the purposes of chart review for care gap closure. The following were reviewed: chart review for care gap closure-colorectal cancer screening.  Incorrect attribution.    [facility] Quality Team
